# Patient Record
Sex: Female | Born: 1990 | Race: Black or African American | Hispanic: No | Marital: Single | Smoking: Never smoker
Health system: Southern US, Community
[De-identification: ages and names within clinical notes are randomized; demographics above are authoritative.]

## PROBLEM LIST (undated history)

## (undated) ENCOUNTER — Inpatient Hospital Stay (HOSPITAL_COMMUNITY): Payer: Self-pay

## (undated) DIAGNOSIS — Z789 Other specified health status: Secondary | ICD-10-CM

## (undated) HISTORY — PX: NO PAST SURGERIES: SHX2092

---

## 2014-08-28 ENCOUNTER — Encounter (HOSPITAL_COMMUNITY): Payer: Self-pay | Admitting: Emergency Medicine

## 2014-08-28 ENCOUNTER — Emergency Department (INDEPENDENT_AMBULATORY_CARE_PROVIDER_SITE_OTHER)
Admission: EM | Admit: 2014-08-28 | Discharge: 2014-08-28 | Disposition: A | Discharge status: Home / Self Care | Payer: BLUE CROSS/BLUE SHIELD | Source: Home / Self Care | Attending: Family Medicine | Admitting: Family Medicine

## 2014-08-28 DIAGNOSIS — O219 Vomiting of pregnancy, unspecified: Secondary | ICD-10-CM | POA: Diagnosis not present

## 2014-08-28 MED ORDER — DOXYLAMINE-PYRIDOXINE 10-10 MG PO TBEC: DELAYED_RELEASE_TABLET | ORAL | Status: DC

## 2014-08-28 MED ORDER — PRENATAL COMPLETE 14-0.4 MG PO TABS: ORAL_TABLET | ORAL | Status: AC

## 2014-08-28 NOTE — ED Provider Notes (Signed)
CSN: [id number]     Arrival date & time 08/28/14  1044 History   First MD Initiated Contact with Patient 08/28/14 1239     Chief Complaint  Patient presents with  . Morning Sickness   (Consider location/radiation/quality/duration/timing/severity/associated sxs/prior Treatment) HPI     24 year old female who is [redacted] weeks pregnant presents complaining of vomiting and very mild feeling of lightheadedness. She states that she has been vomiting in the mornings throughout her pregnancy but has gotten worse in the past couple of days. She called her OB/GYN but they do not call her back so she decided to come here. In the past couple of days she has vomited approximately 6-7 times. Prior to that she has been vomiting in the mornings and occasionally after eating. He denies any abdominal pain or vaginal bleeding. She had her initial ultrasound 3 weeks ago which was normal, showed intrauterine pregnancy  History reviewed. No pertinent past medical history. History reviewed. No pertinent past surgical history. No family history on file. History  Substance Use Topics  . Smoking status: Never Smoker   . Smokeless tobacco: Not on file  . Alcohol Use: No   OB History    Gravida Para Term Preterm AB TAB SAB Ectopic Multiple Living   1              Review of Systems  Gastrointestinal: Positive for nausea and vomiting.  Neurological: Positive for light-headedness.  All other systems reviewed and are negative.   Allergies  Review of patient's allergies indicates no known allergies.  Home Medications   Prior to Admission medications   Medication Sig Start Date End Date Taking? Authorizing Provider  Doxylamine-Pyridoxine 10-10 MG TBEC Take 2 tablets at bedtime daily to treat and prevent nausea and vomiting 08/28/14   Graylon GoodZachary H Holton Sidman, PA-C  Prenatal Vit-Fe Fumarate-FA (PRENATAL COMPLETE) 14-0.4 MG TABS Uses directed on bottle instructions 08/28/14   Graylon GoodZachary H Swathi Dauphin, PA-C   BP 124/76 mmHg  Pulse 76   Temp(Src) 98.1 F (36.7 C) (Oral)  SpO2 100%  LMP 06/24/2014 Physical Exam  Constitutional: She is oriented to person, place, and time. Vital signs are normal. She appears well-developed and well-nourished. No distress.  HENT:  Head: Normocephalic and atraumatic.  Cardiovascular: Normal rate, regular rhythm and normal heart sounds.   Pulmonary/Chest: Effort normal and breath sounds normal. No respiratory distress.  Abdominal: Soft. Bowel sounds are normal. She exhibits no distension and no mass. There is no tenderness. There is no rebound and no guarding.  Neurological: She is alert and oriented to person, place, and time. She has normal strength. Coordination normal.  Skin: Skin is warm and dry. No rash noted. She is not diaphoretic.  Psychiatric: She has a normal mood and affect. Judgment normal.  Nursing note and vitals reviewed.   ED Course  Procedures (including critical care time) Labs Review Labs Reviewed - No data to display  Imaging Review No results found.   MDM   1. Nausea/vomiting in pregnancy    Physical exam is normal, fundus is not palpable. She is not orthostatic. I will prescribe her diclegis for nausea and vomiting with instructions to follow-up with OB/GYN if this does not help her symptoms, or [facility]. Also she is not on prenatal vitamins, she will start taking these.  Meds ordered this encounter  Medications  . DISCONTD: Doxylamine-Pyridoxine 10-10 MG TBEC    Sig: Take 2 tablets at bedtime daily to treat and prevent  nausea and vomiting    Dispense:  60 tablet    Refill:  2  . Doxylamine-Pyridoxine 10-10 MG TBEC    Sig: Take 2 tablets at bedtime daily to treat and prevent nausea and vomiting    Dispense:  60 tablet    Refill:  2  . Prenatal Vit-Fe Fumarate-FA (PRENATAL COMPLETE) 14-0.4 MG TABS    Sig: Uses directed on bottle instructions    Dispense:  60 each    Refill:  8       Graylon GoodZachary H Dietrick Barris,  PA-C 08/28/14 1655

## 2014-08-28 NOTE — Discharge Instructions (Signed)
Morning Sickness Morning sickness is when you feel sick to your stomach (nauseous) during pregnancy. This nauseous feeling may or may not come with vomiting. It often occurs in the morning but can be a problem any time of day. Morning sickness is most common during the first trimester, but it may continue throughout pregnancy. While morning sickness is unpleasant, it is usually harmless unless you develop severe and continual vomiting (hyperemesis gravidarum). This condition requires more intense treatment.  CAUSES  The cause of morning sickness is not completely known but seems to be related to normal hormonal changes that occur in pregnancy. RISK FACTORS You are at greater risk if you:  Experienced nausea or vomiting before your pregnancy.  Had morning sickness during a previous pregnancy.  Are pregnant with more than one baby, such as twins. TREATMENT  Do not use any medicines (prescription, over-the-counter, or herbal) for morning sickness without first talking to your health care provider. Your health care provider may prescribe or recommend:  Vitamin B6 supplements.  Anti-nausea medicines.  The herbal medicine ginger. HOME CARE INSTRUCTIONS   Only take over-the-counter or prescription medicines as directed by your health care provider.  Taking multivitamins before getting pregnant can prevent or decrease the severity of morning sickness in most women.  Eat a piece of dry toast or unsalted crackers before getting out of bed in the morning.  Eat five or six small meals a day.  Eat dry and bland foods (rice, baked potato). Foods high in carbohydrates are often helpful.  Do not drink liquids with your meals. Drink liquids between meals.  Avoid greasy, fatty, and spicy foods.  Get someone to cook for you if the smell of any food causes nausea and vomiting.  If you feel nauseous after taking prenatal vitamins, take the vitamins at night or with a snack.  Snack on protein  foods (nuts, yogurt, cheese) between meals if you are hungry.  Eat unsweetened gelatins for desserts.  Wearing an acupressure wristband (worn for sea sickness) may be helpful.  Acupuncture may be helpful.  Do not smoke.  Get a humidifier to keep the air in your house free of odors.  Get plenty of fresh air. SEEK MEDICAL CARE IF:   Your home remedies are not working, and you need medicine.  You feel dizzy or lightheaded.  You are losing weight. SEEK IMMEDIATE MEDICAL CARE IF:   You have persistent and uncontrolled nausea and vomiting.  You pass out (faint). MAKE SURE YOU:  Understand these instructions.  Will watch your condition.  Will get help right away if you are not doing well or get worse. Document Released: 06/03/2006 Document Revised: 04/17/2013 Document Reviewed: 09/27/2012 [facility] Patient Information 2015 [place], [place]. This information is not intended to replace advice given to you by your health care provider. Make sure you discuss any questions you have with your health care provider.  Hyperemesis Gravidarum Hyperemesis gravidarum is a severe form of nausea and vomiting that happens during pregnancy. Hyperemesis is worse than morning sickness. It may cause you to have nausea or vomiting all day for many days. It may keep you from eating and drinking enough food and liquids. Hyperemesis usually occurs during the first half (the first 20 weeks) of pregnancy. It often goes away once a woman is in her second half of pregnancy. However, sometimes hyperemesis continues through an entire pregnancy.  CAUSES  The cause of this condition is not completely known but is thought to be related to changes in  the body's hormones when pregnant. It could be from the high level of the pregnancy hormone or an increase in estrogen in the body.  SIGNS AND SYMPTOMS   Severe nausea and vomiting.  Nausea that does not go away.  Vomiting that does not allow you to keep any food  down.  Weight loss and body fluid loss (dehydration).  Having no desire to eat or not liking food you have previously enjoyed. DIAGNOSIS  Your health care provider will do a physical exam and ask you about your symptoms. He or she may also order blood tests and urine tests to make sure something else is not causing the problem.  TREATMENT  You may only need medicine to control the problem. If medicines do not control the nausea and vomiting, you will be treated in the hospital to prevent dehydration, increased acid in the blood (acidosis), weight loss, and changes in the electrolytes in your body that may harm the unborn baby (fetus). You may need IV fluids.  HOME CARE INSTRUCTIONS   Only take over-the-counter or prescription medicines as directed by your health care provider.  Try eating a couple of dry crackers or toast in the morning before getting out of bed.  Avoid foods and smells that upset your stomach.  Avoid fatty and spicy foods.  Eat 5-6 small meals a day.  Do not drink when eating meals. Drink between meals.  For snacks, eat high-protein foods, such as cheese.  Eat or suck on things that have ginger in them. Ginger helps nausea.  Avoid food preparation. The smell of food can spoil your appetite.  Avoid iron pills and iron in your multivitamins until after 3-4 months of being pregnant. However, consult with your health care provider before stopping any prescribed iron pills. SEEK MEDICAL CARE IF:   Your abdominal pain increases.  You have a severe headache.  You have vision problems.  You are losing weight. SEEK IMMEDIATE MEDICAL CARE IF:   You are unable to keep fluids down.  You vomit blood.  You have constant nausea and vomiting.  You have excessive weakness.  You have extreme thirst.  You have dizziness or fainting.  You have a fever or persistent symptoms for more than 2-3 days.  You have a fever and your symptoms suddenly get worse. MAKE SURE  YOU:   Understand these instructions.  Will watch your condition.  Will get help right away if you are not doing well or get worse. Document Released: 04/12/2005 Document Revised: 01/31/2013 Document Reviewed: 11/22/2012 [facility] Patient Information 2015 [place], [place]. This information is not intended to replace advice given to you by your health care provider. Make sure you discuss any questions you have with your health care provider.

## 2014-08-28 NOTE — ED Notes (Signed)
C/o vomiting and feeling light headed onset 2 days Sx also include vomiting; 2 month preg Alert, no signs of acute distress.

## 2016-04-28 ENCOUNTER — Emergency Department (HOSPITAL_COMMUNITY)
Admission: EM | Admit: 2016-04-28 | Discharge: 2016-04-28 | Disposition: A | Discharge status: Home / Self Care | Payer: BLUE CROSS/BLUE SHIELD | Attending: Emergency Medicine | Admitting: Emergency Medicine

## 2016-04-28 ENCOUNTER — Encounter (HOSPITAL_COMMUNITY): Payer: Self-pay | Admitting: Emergency Medicine

## 2016-04-28 DIAGNOSIS — R112 Nausea with vomiting, unspecified: Secondary | ICD-10-CM

## 2016-04-28 LAB — COMPREHENSIVE METABOLIC PANEL
ALT: 9 U/L — ABNORMAL LOW (ref 14–54)
ANION GAP: 9 (ref 5–15)
AST: 22 U/L (ref 15–41)
Albumin: 3.8 g/dL (ref 3.5–5.0)
Alkaline Phosphatase: 93 U/L (ref 38–126)
BILIRUBIN TOTAL: 1.4 mg/dL — AB (ref 0.3–1.2)
BUN: 13 mg/dL (ref 6–20)
CO2: 20 mmol/L — ABNORMAL LOW (ref 22–32)
Calcium: 8.6 mg/dL — ABNORMAL LOW (ref 8.9–10.3)
Chloride: 107 mmol/L (ref 101–111)
Creatinine, Ser: 0.53 mg/dL (ref 0.44–1.00)
GFR calc Af Amer: >60 mL/min (ref >60)
GFR calc non Af Amer: >60 mL/min (ref >60)
Glucose, Bld: 100 mg/dL — ABNORMAL HIGH (ref 65–99)
Potassium: 5.2 mmol/L — ABNORMAL HIGH (ref 3.5–5.1)
Sodium: 136 mmol/L (ref 135–145)
TOTAL PROTEIN: 7.1 g/dL (ref 6.5–8.1)

## 2016-04-28 LAB — I-STAT BETA HCG BLOOD, ED (MC, WL, AP ONLY)

## 2016-04-28 LAB — LIPASE, BLOOD: Lipase: 20 U/L (ref 11–51)

## 2016-04-28 MED ORDER — ONDANSETRON 8 MG PO TBDP: 8.0000 mg | ORAL_TABLET | Freq: Three times a day (TID) | ORAL | 0 refills | Status: AC | PRN

## 2016-04-28 MED ORDER — ONDANSETRON 4 MG PO TBDP
8.0000 mg | ORAL_TABLET | Freq: Once | ORAL | Status: AC
  Administered 2016-04-28: 8 mg via ORAL
  Filled 2016-04-28: qty 2

## 2016-04-28 NOTE — ED Provider Notes (Signed)
[facility]-EMERGENCY DEPT Provider Note   CSN: [id number] Arrival date & time: 04/28/16  1702  By signing my name below, I, Arianna Nassar, attest that this documentation has been prepared under the direction and in the presence of Margarita Grizzleanielle Migel Hannis, MD.  Electronically Signed: Octavia HeirArianna Nassar, ED Scribe. 04/28/16. 6:10 PM.    History   Chief Complaint Chief Complaint  Patient presents with  . Generalized Body Aches  . Emesis   The history is provided by the patient. No language interpreter was used.   HPI Comments: Alyssa Bates is a 26 y.o. female who presents to the Emergency Department complaining of sudden onset, gradual worsening, moderate vomiting x last night. She says she was awoken out of her sleep last night feeling dizzy. She says that she vomited twice last night (and once this morning) with associated intermittent chills, generalized boyd aches, and mild abdominal pain. Pt was asymptomatic before going to bed last night. Pt has not taken any medication to relieve her symptoms. She has been around her coworkers who have had similar symptoms. Pt is a non-smoker and does not consume alcohol. Pt is currently on her menstrual cycle. She denies diarrhea or fever.  History reviewed. No pertinent past medical history.  There are no active problems to display for this patient.   History reviewed. No pertinent surgical history.  OB History    Gravida Para Term Preterm AB Living   1             SAB TAB Ectopic Multiple Live Births                   Home Medications    Prior to Admission medications   Medication Sig Start Date End Date Taking? Authorizing Provider  Doxylamine-Pyridoxine 10-10 MG TBEC Take 2 tablets at bedtime daily to treat and prevent nausea and vomiting 08/28/14   Graylon GoodZachary H Baker, PA-C  Prenatal Vit-Fe Fumarate-FA (PRENATAL COMPLETE) 14-0.4 MG TABS Uses directed on bottle instructions 08/28/14   Graylon GoodZachary H Baker, PA-C    Family History History reviewed. No  pertinent family history.  Social History Social History  Substance Use Topics  . Smoking status: Never Smoker  . Smokeless tobacco: Not on file  . Alcohol use No     Allergies   Ibuprofen   Review of Systems Review of Systems  Constitutional: Positive for chills. Negative for fever.  Gastrointestinal: Positive for abdominal pain, nausea and vomiting. Negative for diarrhea.  Musculoskeletal: Positive for myalgias.  All other systems reviewed and are negative.    Physical Exam Updated Vital Signs BP 124/80 (BP Location: Right Arm)   Pulse 103   Temp 99.7 F (37.6 C) (Oral)   Resp 18   SpO2 100%   Physical Exam  Constitutional: She is oriented to person, place, and time. She appears well-developed and well-nourished.  HENT:  Head: Normocephalic.  Eyes: EOM are normal.  Neck: Normal range of motion.  Cardiovascular: Normal rate.   Pulmonary/Chest: Effort normal.  Abdominal: She exhibits no distension.  Musculoskeletal: Normal range of motion.  Neurological: She is alert and oriented to person, place, and time.  Psychiatric: She has a normal mood and affect.  Nursing note and vitals reviewed.    ED Treatments / Results  DIAGNOSTIC STUDIES: Oxygen Saturation is 100% on RA, normal by my interpretation.  COORDINATION OF CARE:  6:09 PM Discussed treatment plan with pt at bedside and pt agreed to plan.  Labs (all labs ordered are listed, but only  abnormal results are displayed) Labs Reviewed  COMPREHENSIVE METABOLIC PANEL - Abnormal; Notable for the following:       Result Value   Potassium 5.2 (*)    CO2 20 (*)    Glucose, Bld 100 (*)    Calcium 8.6 (*)    ALT 9 (*)    Total Bilirubin 1.4 (*)    All other components within normal limits  LIPASE, BLOOD  URINALYSIS, ROUTINE W REFLEX MICROSCOPIC  I-STAT BETA HCG BLOOD, ED ([facility], [facility], [facility] ONLY)   Procedures Procedures (including critical care time)  Medications Ordered in ED Medications - No data to  display   Initial Impression / Assessment and Plan / ED Course  I have reviewed the triage vital signs and the nursing notes.  Pertinent labs & imaging results that were available during my care of the patient were reviewed by me and considered in my medical decision making (see chart for details).  Clinical Course    Patient tolerating fluids here without difficulty and well appearing.  Mild hypocalcemia noted and patient advised re follow up.   Final Clinical Impressions(s) / ED Diagnoses   Final diagnoses:  Non-intractable vomiting with nausea, unspecified vomiting type   I personally performed the services described in this documentation, which was scribed in my presence. The recorded information has been reviewed and considered.  New Prescriptions New Prescriptions   No medications on file     Margarita Grizzle, MD 04/28/16 2041

## 2016-04-28 NOTE — ED Triage Notes (Signed)
Pt sts body aches and URI sx with vomiting x 2 days

## 2016-08-09 ENCOUNTER — Inpatient Hospital Stay (HOSPITAL_COMMUNITY)
Admission: AD | Admit: 2016-08-09 | Discharge: 2016-08-09 | Disposition: A | Discharge status: Home / Self Care | Payer: Medicaid Other | Source: Ambulatory Visit | Attending: Obstetrics and Gynecology | Admitting: Obstetrics and Gynecology

## 2016-08-09 ENCOUNTER — Encounter (HOSPITAL_COMMUNITY): Payer: Self-pay | Admitting: *Deleted

## 2016-08-09 DIAGNOSIS — O209 Hemorrhage in early pregnancy, unspecified: Secondary | ICD-10-CM

## 2016-08-09 DIAGNOSIS — Z3A01 Less than 8 weeks gestation of pregnancy: Secondary | ICD-10-CM | POA: Diagnosis not present

## 2016-08-09 DIAGNOSIS — O039 Complete or unspecified spontaneous abortion without complication: Secondary | ICD-10-CM | POA: Diagnosis not present

## 2016-08-09 DIAGNOSIS — Z87891 Personal history of nicotine dependence: Secondary | ICD-10-CM | POA: Insufficient documentation

## 2016-08-09 DIAGNOSIS — O4690 Antepartum hemorrhage, unspecified, unspecified trimester: Secondary | ICD-10-CM | POA: Insufficient documentation

## 2016-08-09 HISTORY — DX: Other specified health status: Z78.9

## 2016-08-09 LAB — URINALYSIS, ROUTINE W REFLEX MICROSCOPIC
Bilirubin Urine: NEGATIVE
Glucose, UA: NEGATIVE mg/dL
Ketones, ur: NEGATIVE mg/dL
NITRITE: NEGATIVE
PROTEIN: 30 mg/dL — AB
SPECIFIC GRAVITY, URINE: 1.023 (ref 1.005–1.030)
pH: 7 (ref 5.0–8.0)

## 2016-08-09 LAB — HCG, QUANTITATIVE, PREGNANCY: HCG, BETA CHAIN, QUANT, S: 103 m[IU]/mL — AB (ref <5)

## 2016-08-09 LAB — ABO/RH: ABO/RH(D): B POS

## 2016-08-09 NOTE — MAU Note (Signed)
Found out preg last Wed,  Went to [facility] for bleeding or Friday.  Was dx with UTI, had [place]- viable IUP.  Is taking the antibiotics, but still bleeding so wanted to get checked.

## 2016-08-09 NOTE — MAU Provider Note (Signed)
History     CSN: [id number]  Arrival date and time: 08/09/16 1121   First Provider Initiated Contact with Patient 08/09/16 1251      No chief complaint on file.  HPI  Alyssa Bates is a 26 y.o. female G2P1001 @ [redacted]w[redacted]d here in MAU with continued vaginal bleeding. She was seen 2 days ago at [facility] with the same complaints. She had intercourse that day and started spotting. The bleeding has not increased, it comes and goes. She denies pain. The bleeding, at times, is similar to her menstrual cycle.   B positive blood type   OB History    Gravida Para Term Preterm AB Living   SAB TAB Ectopic Multiple Live Births           1      Past Medical History:  Diagnosis Date  . Medical history non-contributory     Past Surgical History:  Procedure Laterality Date  . NO PAST SURGERIES      No family history on file.  Social History  Substance Use Topics  . Smoking status: Former Smoker    Quit date: 07/25/2016  . Smokeless tobacco: Never Used  . Alcohol use No    Allergies:  Allergies  Allergen Reactions  . Ibuprofen Swelling    facial    Prescriptions Prior to Admission  Medication Sig Dispense Refill Last Dose  . nitrofurantoin, macrocrystal-monohydrate, (MACROBID) 100 MG capsule Take 100 mg by mouth 2 (two) times daily. 7 day course     . Doxylamine-Pyridoxine 10-10 MG TBEC Take 2 tablets at bedtime daily to treat and prevent nausea and vomiting (Patient not taking: Reported on 08/09/2016) 60 tablet 2 Not Taking at Unknown time  . ondansetron (ZOFRAN ODT) 8 MG disintegrating tablet Take 1 tablet (8 mg total) by mouth every 8 (eight) hours as needed for nausea or vomiting. (Patient not taking: Reported on 08/09/2016) 20 tablet 0 Not Taking at Unknown time  . Prenatal Vit-Fe Fumarate-FA (PRENATAL COMPLETE) 14-0.4 MG TABS Uses directed on bottle instructions (Patient not taking: Reported on 08/09/2016) 60 each 8 Not Taking at Unknown time    Results for orders placed or performed during the hospital encounter of 08/09/16 (from the past 48 hour(s))  Urinalysis, Routine w reflex microscopic     Status: Abnormal   Collection Time: 08/09/16 11:45 AM  Result Value Ref Range   Color, Urine AMBER (A) YELLOW    Comment: BIOCHEMICALS MAY BE AFFECTED BY COLOR   APPearance HAZY (A) CLEAR   Specific Gravity, Urine 1.023 1.005 - 1.030   pH 7.0 5.0 - 8.0   Glucose, UA NEGATIVE NEGATIVE mg/dL   Hgb urine dipstick LARGE (A) NEGATIVE   Bilirubin Urine NEGATIVE NEGATIVE   Ketones, ur NEGATIVE NEGATIVE mg/dL   Protein, ur 30 (A) NEGATIVE mg/dL   Nitrite NEGATIVE NEGATIVE   Leukocytes, UA SMALL (A) NEGATIVE   RBC / HPF TOO NUMEROUS TO COUNT 0 - 5 RBC/hpf   WBC, UA 6-30 0 - 5 WBC/hpf   Bacteria, UA RARE (A) NONE SEEN   Squamous Epithelial / LPF 0-5 (A) NONE SEEN   Mucous PRESENT   hCG, quantitative, pregnancy     Status: Abnormal   Collection Time: 08/09/16  1:43 PM  Result Value Ref Range   hCG, Beta Chain, Quant, S 103 (H) <5 mIU/mL    Comment:          GEST. AGE  CONC.  (mIU/mL)   <=1 WEEK        5 - 50     2 WEEKS       50 - 500     3 WEEKS       100 - 10,000     4 WEEKS     1,000 - 30,000     5 WEEKS     3,500 - 115,000   6-8 WEEKS     12,000 - 270,000    12 WEEKS     15,000 - 220,000        FEMALE AND NON-PREGNANT FEMALE:     LESS THAN 5 mIU/mL   ABO/Rh     Status: None   Collection Time: 08/09/16  1:43 PM  Result Value Ref Range   ABO/RH(D) B POS     Review of Systems  Constitutional: Negative for fever.  Gastrointestinal: Negative for abdominal distention.  Genitourinary: Positive for vaginal bleeding.   Physical Exam   Blood pressure 128/85, pulse 73, temperature 98.3 F (36.8 C), temperature source Oral, resp. rate 16, height  (1.6 m), weight 211 lb 4 oz (95.8 kg), last menstrual period 06/20/2016, SpO2 100 %.  Physical Exam  Constitutional: She is oriented to person, place, and time. She appears  well-developed and well-nourished. No distress.  HENT:  Head: Normocephalic.  Eyes: Pupils are equal, round, and reactive to light.  GI: Soft. She exhibits no distension. There is no tenderness. There is no rebound.  Musculoskeletal: Normal range of motion.  Neurological: She is alert and oriented to person, place, and time.  Skin: Skin is warm. She is not diaphoretic. No pallor.  Psychiatric: Her behavior is normal.   MAU Course  Procedures  None  MDM  Quant yesterday found in Care Everywhere: 874, US shows SIUP with cardiac activity.  Beta hcg ordered today While patient waiting for quant, patient passed a silver dollar sized clot; with tissue like appearance. Sent to pathology for ?POC, bleeding minimal at this time.  Quant down today: 103  Assessment and Plan   A:  1. SAB (spontaneous abortion)   2. Vaginal bleeding in pregnancy, first trimester     P:  Discharge home in stable condition Ok to take ibuprofen over the counter, as directed on the bottle.  Message sent to the [facility] for follow up in 1-2 weeks.   Bleeding precautions Return if symptoms worsen   Duane Lope, NP 08/11/2016 1:28 PM

## 2016-08-09 NOTE — Discharge Instructions (Signed)

## 2016-08-25 ENCOUNTER — Other Ambulatory Visit: Payer: Self-pay

## 2016-10-11 ENCOUNTER — Emergency Department (HOSPITAL_BASED_OUTPATIENT_CLINIC_OR_DEPARTMENT_OTHER)
Admission: EM | Admit: 2016-10-11 | Discharge: 2016-10-11 | Disposition: A | Discharge status: Home / Self Care | Payer: Medicaid Other | Attending: Emergency Medicine | Admitting: Emergency Medicine

## 2016-10-11 ENCOUNTER — Encounter (HOSPITAL_BASED_OUTPATIENT_CLINIC_OR_DEPARTMENT_OTHER): Payer: Self-pay | Admitting: *Deleted

## 2016-10-11 DIAGNOSIS — Z5321 Procedure and treatment not carried out due to patient leaving prior to being seen by health care provider: Secondary | ICD-10-CM | POA: Diagnosis not present

## 2016-10-11 DIAGNOSIS — H5712 Ocular pain, left eye: Secondary | ICD-10-CM | POA: Diagnosis present
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
## 2016-10-11 NOTE — ED Notes (Signed)
Pt states she has to leave d/t child care; states if it gets worse she will come back

## 2019-04-04 ENCOUNTER — Encounter (HOSPITAL_BASED_OUTPATIENT_CLINIC_OR_DEPARTMENT_OTHER): Payer: Self-pay

## 2019-04-04 ENCOUNTER — Emergency Department (HOSPITAL_BASED_OUTPATIENT_CLINIC_OR_DEPARTMENT_OTHER)
Admission: EM | Admit: 2019-04-04 | Discharge: 2019-04-04 | Disposition: A | Discharge status: Home / Self Care | Payer: Self-pay | Attending: Emergency Medicine | Admitting: Emergency Medicine

## 2019-04-04 ENCOUNTER — Other Ambulatory Visit: Payer: Self-pay

## 2019-04-04 DIAGNOSIS — R519 Headache, unspecified: Secondary | ICD-10-CM | POA: Insufficient documentation

## 2019-04-04 MED ORDER — METOCLOPRAMIDE HCL 5 MG/ML IJ SOLN
10.0000 mg | Freq: Once | INTRAMUSCULAR | Status: AC
  Administered 2019-04-04: 10 mg via INTRAMUSCULAR
  Filled 2019-04-04: qty 2

## 2019-04-04 MED ORDER — ACETAMINOPHEN 500 MG PO TABS
1000.0000 mg | ORAL_TABLET | Freq: Once | ORAL | Status: AC
  Administered 2019-04-04: 18:00:00 1000 mg via ORAL
  Filled 2019-04-04: qty 2

## 2019-04-04 NOTE — ED Triage Notes (Signed)
Pt c/o HA x 4 days-pain site is across forehead-denies head injury-denies fever/flu like sx-NAD-steady gait

## 2019-04-04 NOTE — ED Provider Notes (Signed)
[facility] EMERGENCY DEPARTMENT Provider Note   CSN: [id number] Arrival date & time: 04/04/19  1633     History   Chief Complaint Chief Complaint  Patient presents with  . Headache    HPI Alyssa Bates is a 28 y.o. female.     Patient c/o frontal headache intermittently for past 3-4 days. Symptoms gradual onset, mild-moderate, recurrent, dull to throbbing, not radiating. No hx migraines or chronic headaches, gets occasional headache in past. Symptoms last several minutes to a few hours. Not associated with any certain activity, position or time of day. No photophobia. No eye pain or change in vision. No recent head injury, trauma, fall, or syncope. No neck pain or stiffness. Denies change in speech or vision. No numbness/weakness, or change in gait/balance or normal functional ability. No fever or chills. No sinus drainage or pain. Tried acetaminophen 3 days ago w some relief, but no meds since. No acute, abrupt, severe, or thunderclap type headaches.   The history is provided by the patient.  Headache Associated symptoms: no abdominal pain, no cough, no eye pain, no fever, no nausea, no neck pain, no neck stiffness, no numbness, no sore throat, no vomiting and no weakness     Past Medical History:  Diagnosis Date  . Medical history non-contributory     There are no active problems to display for this patient.   Past Surgical History:  Procedure Laterality Date  . NO PAST SURGERIES       OB History    Gravida  2   Para  1   Term  1   Preterm      AB      Living  1     SAB      TAB      Ectopic      Multiple      Live Births  1            Home Medications    Prior to Admission medications   Medication Sig Start Date End Date Taking? Authorizing Provider  ondansetron (ZOFRAN ODT) 8 MG disintegrating tablet Take 1 tablet (8 mg total) by mouth every 8 (eight) hours as needed for nausea or vomiting. Patient not taking: Reported  on 08/09/2016 04/28/16   Margarita Grizzleay, Danielle, MD  Prenatal Vit-Fe Fumarate-FA (PRENATAL COMPLETE) 14-0.4 MG TABS Uses directed on bottle instructions Patient not taking: Reported on 08/09/2016 08/28/14   Graylon GoodBaker, Zachary H, PA-C    Family History No family history on file.  Social History Social History   Tobacco Use  . Smoking status: Never Smoker  . Smokeless tobacco: Never Used  Substance Use Topics  . Alcohol use: Yes    Comment: occ  . Drug use: No     Allergies   Ibuprofen   Review of Systems Review of Systems  Constitutional: Negative for chills and fever.  HENT: Negative for sinus pain and sore throat.   Eyes: Negative for pain, redness and visual disturbance.  Respiratory: Negative for cough and shortness of breath.   Cardiovascular: Negative for chest pain.  Gastrointestinal: Negative for abdominal pain, nausea and vomiting.  Genitourinary: Negative for flank pain.  Musculoskeletal: Negative for neck pain and neck stiffness.  Skin: Negative for rash.  Neurological: Positive for headaches. Negative for speech difficulty, weakness and numbness.  Hematological: Does not bruise/bleed easily.  Psychiatric/Behavioral: Negative for confusion.     Physical Exam Updated Vital Signs BP 140/85 (BP Location: Right Arm)   Pulse 82  Temp 98.5 F (36.9 C) (Oral)   Resp 18   Ht 1.6 m (5\' 3" )   Wt 100.2 kg   LMP 03/29/2019   SpO2 100%   BMI 39.15 kg/m   Physical Exam Vitals signs and nursing note reviewed.  Constitutional:      General: She is not in acute distress.    Appearance: Normal appearance. She is well-developed. She is not diaphoretic.  HENT:     Head: Atraumatic.     Comments: No sinus or temporal tenderness.     Nose: Nose normal.     Mouth/Throat:     Mouth: Mucous membranes are moist.  Eyes:     General: No scleral icterus.    Conjunctiva/sclera: Conjunctivae normal.     Pupils: Pupils are equal, round, and reactive to light.  Neck:      Musculoskeletal: Normal range of motion and neck supple. No neck rigidity or muscular tenderness.     Thyroid: No thyromegaly.     Trachea: No tracheal deviation.     Comments: No stiffness or rigidity.  Cardiovascular:     Rate and Rhythm: Normal rate and regular rhythm.     Pulses: Normal pulses.     Heart sounds: Normal heart sounds. No murmur. No friction rub. No gallop.   Pulmonary:     Effort: Pulmonary effort is normal. No respiratory distress.     Breath sounds: Normal breath sounds.  Abdominal:     General: Bowel sounds are normal. There is no distension.     Palpations: Abdomen is soft.     Tenderness: There is no abdominal tenderness.  Genitourinary:    Comments: No cva tenderness.  Musculoskeletal: Normal range of motion.        General: No swelling or tenderness.  Skin:    General: Skin is warm and dry.     Findings: No rash.  Neurological:     Mental Status: She is alert and oriented to person, place, and time.     Cranial Nerves: No cranial nerve deficit.     Comments: Alert, speech normal. Motor intact bil, stre 5/5. No pronator drift. sens grossly intact bil. Steady gait.   Psychiatric:        Mood and Affect: Mood normal.      ED Treatments / Results  Labs (all labs ordered are listed, but only abnormal results are displayed) Labs Reviewed - No data to display  EKG None  Radiology No results found.  Procedures Procedures (including critical care time)  Medications Ordered in ED Medications  metoCLOPramide (REGLAN) injection 10 mg (has no administration in time range)  acetaminophen (TYLENOL) tablet 1,000 mg (has no administration in time range)     Initial Impression / Assessment and Plan / ED Course  I have reviewed the triage vital signs and the nursing notes.  Pertinent labs & imaging results that were available during my care of the patient were reviewed by me and considered in my medical decision making (see chart for details).  No meds  pta/today. reglan im. Acetaminophen po.  Po fluids.  Reviewed nursing notes and prior charts for additional history.   Recheck, headache improved, but comfortable, no n/v.   Patient currently appears stable for d/c.   Rec pcp f/u.  Return precautions provided.     Final Clinical Impressions(s) / ED Diagnoses   Final diagnoses:  None    ED Discharge Orders    None       Lajean Saver,  MD 04/04/19 1835

## 2019-04-04 NOTE — Discharge Instructions (Addendum)
It was our pleasure to provide your ER care today - we hope that you feel better.  Rest. Drink plenty of fluids.  Try taking excedrin or acetaminophen as need for headache.   Follow up with primary care doctor in 1 week if symptoms fail to improve/resolve.  Return to ER if worse, new symptoms, fevers, worsening or severe pain, persistent vomiting, change in speech or vision, numbness or weakness, or other concern.

## 2020-02-17 ENCOUNTER — Other Ambulatory Visit: Payer: Self-pay

## 2020-02-17 ENCOUNTER — Emergency Department (HOSPITAL_BASED_OUTPATIENT_CLINIC_OR_DEPARTMENT_OTHER)
Admission: EM | Admit: 2020-02-17 | Discharge: 2020-02-17 | Disposition: A | Discharge status: Home / Self Care | Payer: BLUE CROSS/BLUE SHIELD | Attending: Emergency Medicine | Admitting: Emergency Medicine

## 2020-02-17 ENCOUNTER — Emergency Department (HOSPITAL_BASED_OUTPATIENT_CLINIC_OR_DEPARTMENT_OTHER): Payer: BLUE CROSS/BLUE SHIELD

## 2020-02-17 ENCOUNTER — Encounter (HOSPITAL_BASED_OUTPATIENT_CLINIC_OR_DEPARTMENT_OTHER): Payer: Self-pay | Admitting: Emergency Medicine

## 2020-02-17 DIAGNOSIS — O209 Hemorrhage in early pregnancy, unspecified: Secondary | ICD-10-CM | POA: Diagnosis not present

## 2020-02-17 DIAGNOSIS — O26892 Other specified pregnancy related conditions, second trimester: Secondary | ICD-10-CM | POA: Diagnosis present

## 2020-02-17 DIAGNOSIS — Z3A20 20 weeks gestation of pregnancy: Secondary | ICD-10-CM | POA: Insufficient documentation

## 2020-02-17 DIAGNOSIS — N939 Abnormal uterine and vaginal bleeding, unspecified: Secondary | ICD-10-CM

## 2020-02-17 DIAGNOSIS — N3001 Acute cystitis with hematuria: Secondary | ICD-10-CM

## 2020-02-17 DIAGNOSIS — O2310 Infections of bladder in pregnancy, unspecified trimester: Secondary | ICD-10-CM | POA: Insufficient documentation

## 2020-02-17 DIAGNOSIS — O4690 Antepartum hemorrhage, unspecified, unspecified trimester: Secondary | ICD-10-CM

## 2020-02-17 LAB — URINALYSIS, MICROSCOPIC (REFLEX)

## 2020-02-17 LAB — URINALYSIS, ROUTINE W REFLEX MICROSCOPIC
Bilirubin Urine: NEGATIVE
Glucose, UA: NEGATIVE mg/dL
Ketones, ur: >80 mg/dL — AB
Nitrite: NEGATIVE
Protein, ur: 30 mg/dL — AB
Specific Gravity, Urine: 1.025 (ref 1.005–1.030)
pH: 7 (ref 5.0–8.0)

## 2020-02-17 LAB — CBC
HCT: 35.8 % — ABNORMAL LOW (ref 36.0–46.0)
Hemoglobin: 12 g/dL (ref 12.0–15.0)
MCH: 29.4 pg (ref 26.0–34.0)
MCHC: 33.5 g/dL (ref 30.0–36.0)
MCV: 87.7 fL (ref 80.0–100.0)
Platelets: 198 10*3/uL (ref 150–400)
RBC: 4.08 MIL/uL (ref 3.87–5.11)
RDW: 16.8 % — ABNORMAL HIGH (ref 11.5–15.5)
WBC: 8.8 10*3/uL (ref 4.0–10.5)
nRBC: 0 % (ref 0.0–0.2)

## 2020-02-17 LAB — HCG, QUANTITATIVE, PREGNANCY: hCG, Beta Chain, Quant, S: 18425 m[IU]/mL — ABNORMAL HIGH (ref <5)

## 2020-02-17 MED ORDER — CEPHALEXIN 500 MG PO CAPS: 500.0000 mg | ORAL_CAPSULE | Freq: Three times a day (TID) | ORAL | 0 refills | Status: AC

## 2020-02-17 NOTE — ED Notes (Signed)
Patient transported to us 

## 2020-02-17 NOTE — ED Notes (Signed)
She is in no distress. As I write this she is being examined by our P.A. Gerre Pebbles.

## 2020-02-17 NOTE — ED Triage Notes (Signed)
[redacted] weeks pregnant, c/o vaginal bleeding and mild cramping today. Followed by [facility]

## 2020-02-17 NOTE — Discharge Instructions (Signed)
Please take the Keflex antibiotics, as directed.  Continue with your at home B6 medication as needed for nausea symptoms. Drink plenty of fluids.  Your comprehensive work-up today was reassuring.  You will need to follow-up with your OB/GYN for your appointment on 02/19/2020, as scheduled.  Return to the ED or seek immediate medical attention should you experience any new or worsening symptoms.

## 2020-02-17 NOTE — ED Provider Notes (Signed)
[facility] EMERGENCY DEPARTMENT Provider Note   CSN: [id number] Arrival date & time: 02/17/20  1107     History Chief Complaint  Patient presents with  . Vaginal Bleeding    [redacted] weeks pregnant    Alyssa Bates is a 29 y.o. female G4P111 approximately [redacted] weeks gestation followed by Dr. Haze Justin, [facility] who presents to the ED with complaints of vaginal bleeding and mild cramping.  Patient reports that she has a history of planned abortion, spontaneous abortion, as well as her last pregnancy which resulted in full-term vaginal delivery with no complications.  She reports that this pregnancy has been complicated by significant nausea and dietary restrictions.  She states that she has been taking B6 vitamins that with her nausea symptoms.  She states that this morning she noticed vaginal spotting which prompted her to come to the ED for evaluation.  She states that it is not as heavy as her menses.  She denies any recent sexual intercourse x2.5 months.  No precipitating injury or trauma.  She is also denying any significant abdominal pain, dysuria, increased urinary frequency, fevers or chills, or other symptoms.  HPI     Past Medical History:  Diagnosis Date  . Medical history non-contributory     There are no problems to display for this patient.   Past Surgical History:  Procedure Laterality Date  . NO PAST SURGERIES       OB History    Gravida  3   Para  1   Term  1   Preterm      AB      Living  1     SAB      TAB      Ectopic      Multiple      Live Births  1           No family history on file.  Social History   Tobacco Use  . Smoking status: Never Smoker  . Smokeless tobacco: Never Used  Vaping Use  . Vaping Use: Never used  Substance Use Topics  . Alcohol use: Yes    Comment: occ  . Drug use: No    Home Medications Prior to Admission medications   Medication Sig Start Date End Date Taking? Authorizing  Provider  cephALEXin (KEFLEX) 500 MG capsule Take 1 capsule (500 mg total) by mouth 3 (three) times daily for 5 days. 02/17/20 02/22/20  Lorelee New, PA-C  ondansetron (ZOFRAN ODT) 8 MG disintegrating tablet Take 1 tablet (8 mg total) by mouth every 8 (eight) hours as needed for nausea or vomiting. Patient not taking: Reported on 08/09/2016 04/28/16   Margarita Grizzle, MD  Prenatal Vit-Fe Fumarate-FA (PRENATAL COMPLETE) 14-0.4 MG TABS Uses directed on bottle instructions Patient not taking: Reported on 08/09/2016 08/28/14   Graylon Good, PA-C    Allergies    Ibuprofen  Review of Systems   Review of Systems  All other systems reviewed and are negative.   Physical Exam Updated Vital Signs BP 113/80 (BP Location: Right Arm)   Pulse (!) 101   Temp 99.4 F (37.4 C) (Oral)   Resp 16   Ht 5\' 3"  (1.6 m)   Wt 93.9 kg   LMP 03/29/2019   SpO2 97%   BMI 36.67 kg/m   Physical Exam Vitals and nursing note reviewed. Exam conducted with a chaperone present.  Constitutional:      Appearance: Normal appearance.  HENT:  Head: Normocephalic and atraumatic.  Eyes:     General: No scleral icterus.    Conjunctiva/sclera: Conjunctivae normal.  Cardiovascular:     Rate and Rhythm: Normal rate and regular rhythm.     Pulses: Normal pulses.     Heart sounds: Normal heart sounds.  Pulmonary:     Effort: Pulmonary effort is normal. No respiratory distress.     Breath sounds: Normal breath sounds.  Abdominal:     Comments: Pregnant.  No significant suprapubic or periumbilical tenderness to palpation.  No overlying skin changes.  Skin:    General: Skin is dry.     Capillary Refill: Capillary refill takes less than 2 seconds.  Neurological:     Mental Status: She is alert and oriented to person, place, and time.     GCS: GCS eye subscore is 4. GCS verbal subscore is 5. GCS motor subscore is 6.  Psychiatric:        Mood and Affect: Mood normal.        Behavior: Behavior normal.         Thought Content: Thought content normal.     ED Results / Procedures / Treatments   Labs (all labs ordered are listed, but only abnormal results are displayed) Labs Reviewed  URINALYSIS, ROUTINE W REFLEX MICROSCOPIC - Abnormal; Notable for the following components:      Result Value   Color, Urine ORANGE (*)    APPearance CLOUDY (*)    Hgb urine dipstick SMALL (*)    Ketones, ur >80 (*)    Protein, ur 30 (*)    Leukocytes,Ua MODERATE (*)    All other components within normal limits  HCG, QUANTITATIVE, PREGNANCY - Abnormal; Notable for the following components:   hCG, Beta Chain, Quant, S 18,425 (*)    All other components within normal limits  CBC - Abnormal; Notable for the following components:   HCT 35.8 (*)    RDW 16.8 (*)    All other components within normal limits  URINALYSIS, MICROSCOPIC (REFLEX) - Abnormal; Notable for the following components:   Bacteria, UA MANY (*)    All other components within normal limits    EKG None  Radiology US OB Limited  Result Date: 02/17/2020 CLINICAL DATA:  29 year old pregnant female with pelvic pain and spotting today. Reported gestational age of [redacted] weeks 4 days. EXAM: LIMITED OBSTETRIC ULTRASOUND FINDINGS: Number of Fetuses: 1 Heart Rate:  144 bpm Movement: Present Presentation: Breech Previa: No Placental Location: Fundal anterior Amniotic Fluid (Subjective): Normal BPD:  4.7cm 20w 4d Maternal Findings: Cervix:  Closed Uterus/Adnexae: No abnormality visualized. IMPRESSION: 1. Single living intrauterine gestation with estimated gestational age of [redacted] weeks 4 days. No acute abnormalities identified. 2. This exam is performed on an emergent basis and does not comprehensively evaluate fetal size, dating, or anatomy; follow-up complete OB US should be considered if further fetal assessment is warranted. Electronically Signed   By: Harmon Pier M.D.   On: 02/17/2020 12:53    Procedures Procedures (including critical care  time)  Medications Ordered in ED Medications - No data to display  ED Course  I have reviewed the triage vital signs and the nursing notes.  Pertinent labs & imaging results that were available during my care of the patient were reviewed by me and considered in my medical decision making (see chart for details).    MDM Rules/Calculators/A&P  Patient reports scant bleeding, however her bleeding is not significant or heavier than her typical menses.  No passage of large clots or tissue.  Patient denies any significant abdominal pain, fevers or chills, or other symptoms.  My physical exam was reassuring.  The cervical os was closed and the cervix was nonerythematous.  No obvious passage of fetal products.  There was no significant bleeding appreciated or evidence of traumatic laceration.  Bimanual exam was also unremarkable.    Labs CBC: Hemoglobin within normal notes of 12.0.  No leukocytosis. UA: Moderate leukocytes with orange color and cloudy appearance.  Many bacteria on reflex.  Beta-hCG quantitative: In-process.   Imaging US OB transvaginal < 14 weeks: Single live intrauterine gestation with estimated gestational age of [redacted] weeks 4 days.  No acute antibodies identified.  Given UA findings, will treat with Keflex x 5 days.  Patient has a follow-up with her OB/GYN on 02/19/2020.  She is reassured by today's competence of work-up and is in no acute distress.  Strict ED return precautions discussed.  Patient voices understanding is agreeable to plan.  Final Clinical Impression(s) / ED Diagnoses Final diagnoses:  Vaginal bleeding in pregnancy  Acute cystitis with hematuria    Rx / DC Orders ED Discharge Orders         Ordered    cephALEXin (KEFLEX) 500 MG capsule  3 times daily        02/17/20 1341           Elvera Maria 02/17/20 1341    Vanetta Mulders, MD 02/22/20 2201

## 2020-05-08 ENCOUNTER — Other Ambulatory Visit: Payer: Self-pay

## 2020-05-08 DIAGNOSIS — Z20822 Contact with and (suspected) exposure to covid-19: Secondary | ICD-10-CM

## 2020-05-10 LAB — NOVEL CORONAVIRUS, NAA: SARS-CoV-2, NAA: NOT DETECTED

## 2020-05-10 LAB — SARS-COV-2, NAA 2 DAY TAT

## 2021-12-12 IMAGING — US US OB LIMITED
1 series · 14 of 28 positions shown · non-contrast
Comparison: none

CLINICAL DATA: 29-year-old pregnant female with pelvic pain and
spotting today. Reported gestational age of 20 weeks 4 days.

EXAM:
LIMITED OBSTETRIC ULTRASOUND

[Series 1: us ob limited · 14 of 29 slices shown]
[im 2/29]
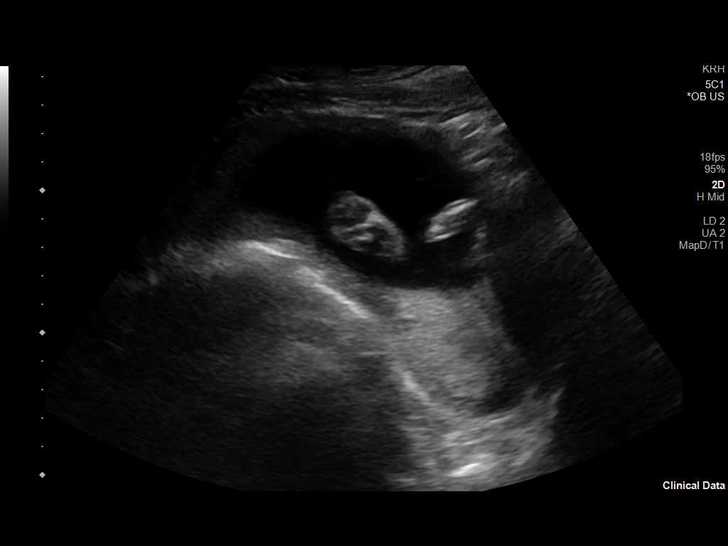
[im 4/29]
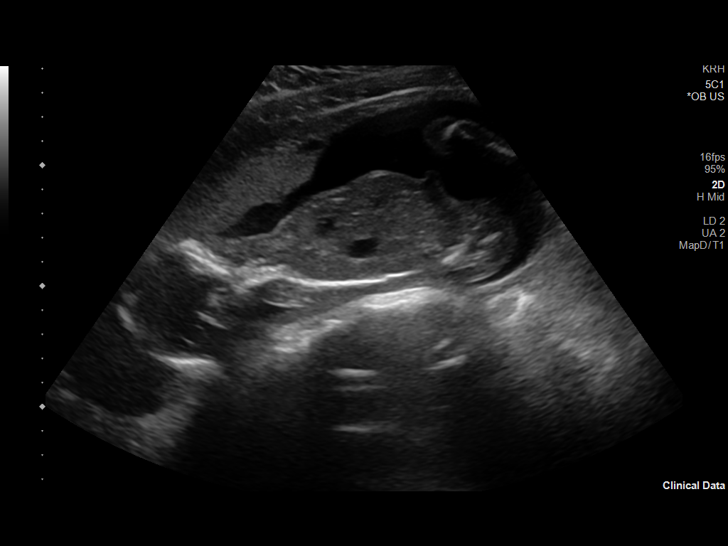
[im 6/29]
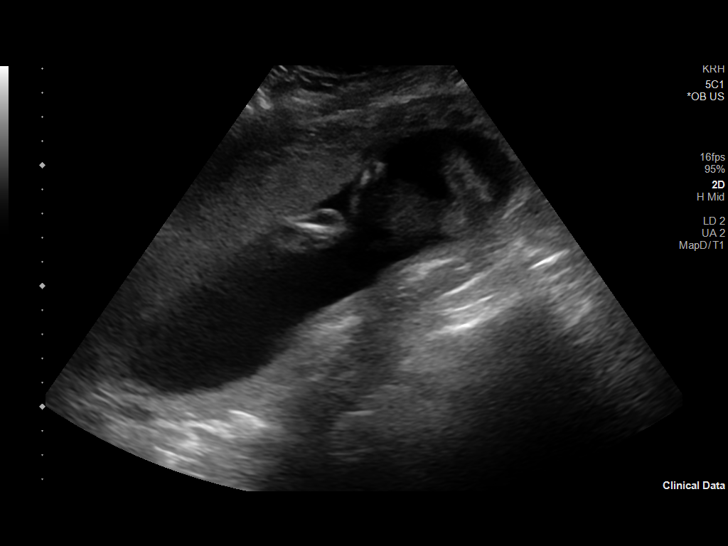
[im 8/29]
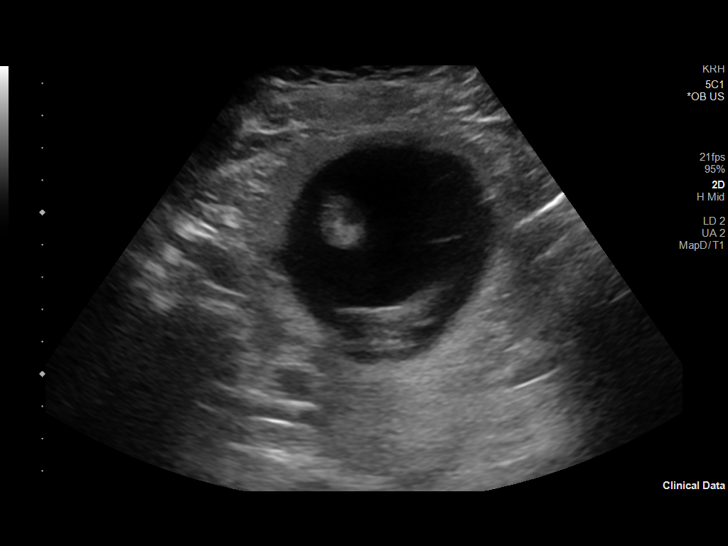
[im 10/29]
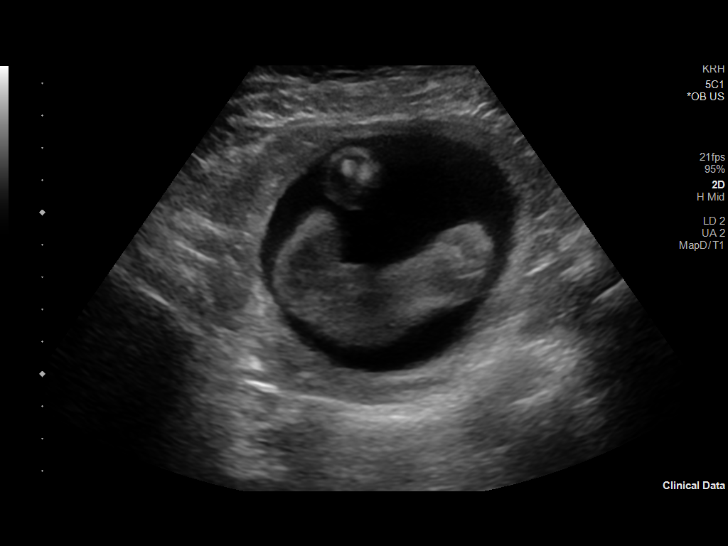
[im 12/29]
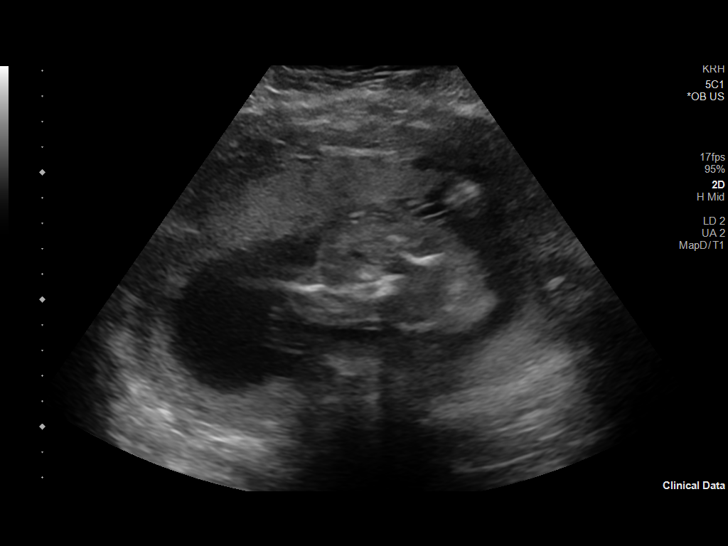
[im 14/29]
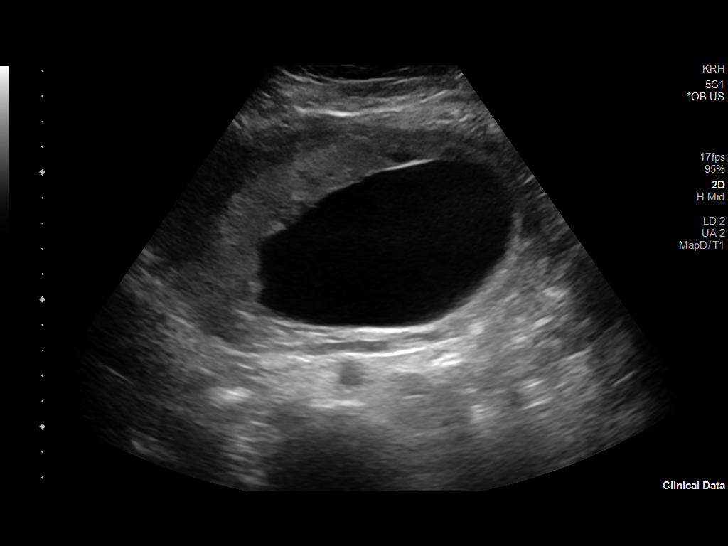
[im 16/29]
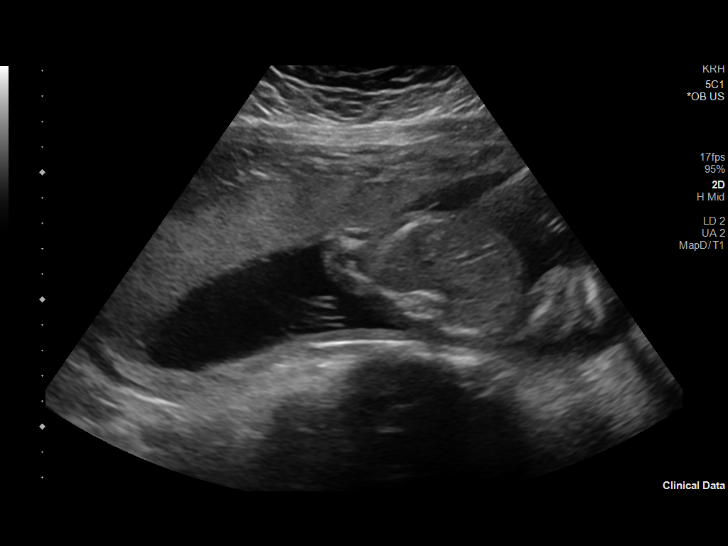
[im 18/29]
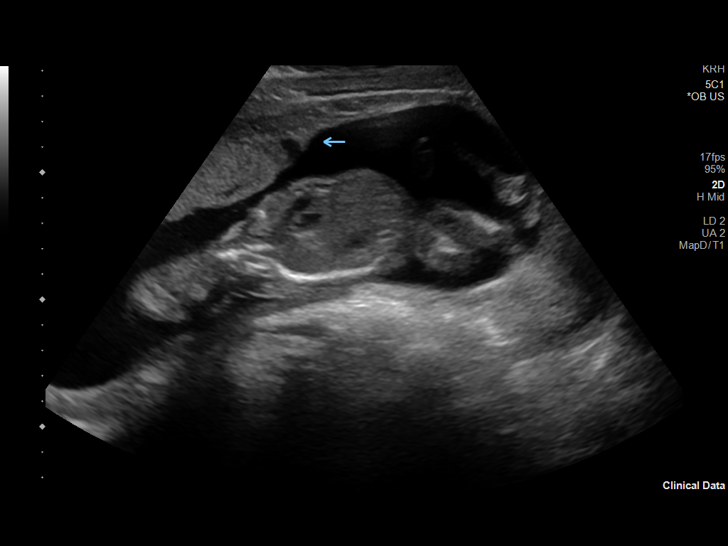
[im 20/29]
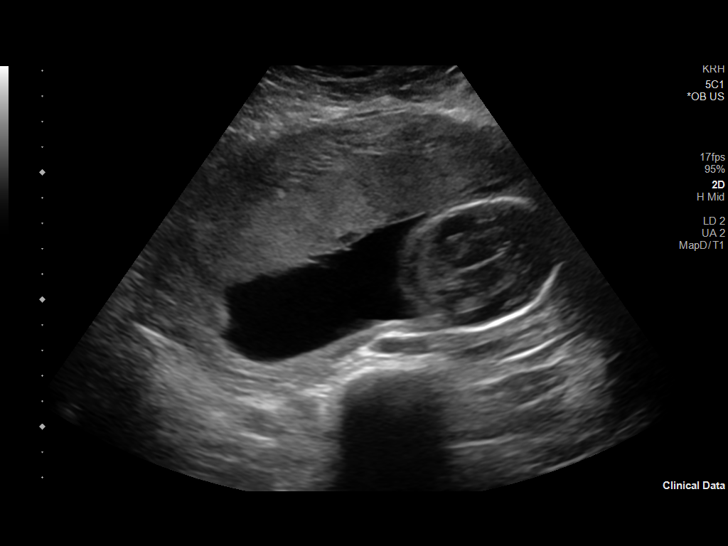
[im 22/29]
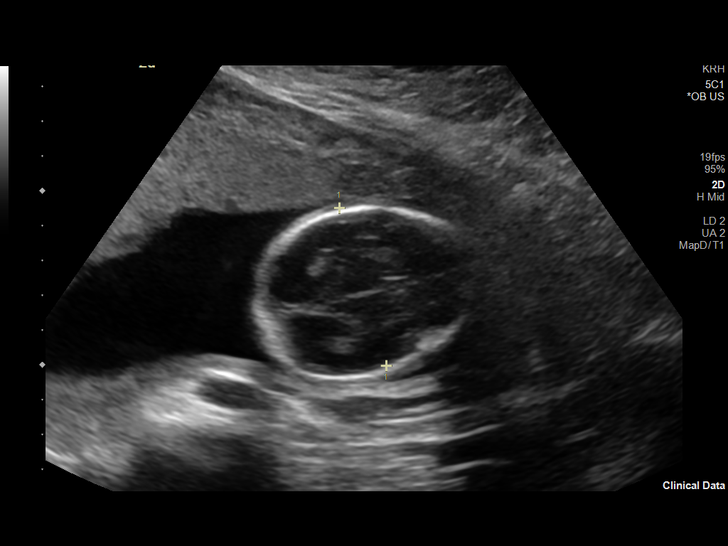
[im 24/29]
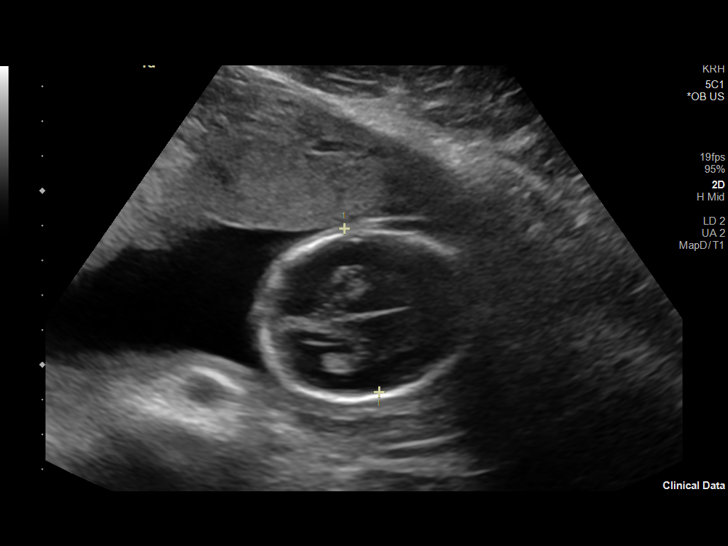
[im 26/29]
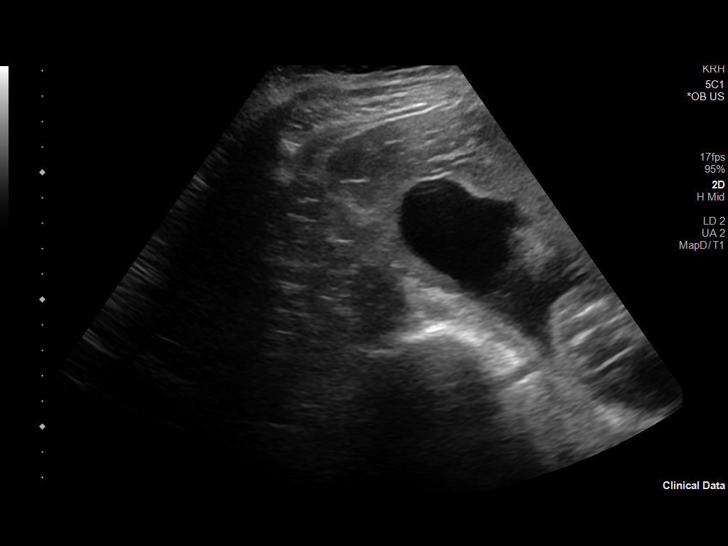
[im 29/29]
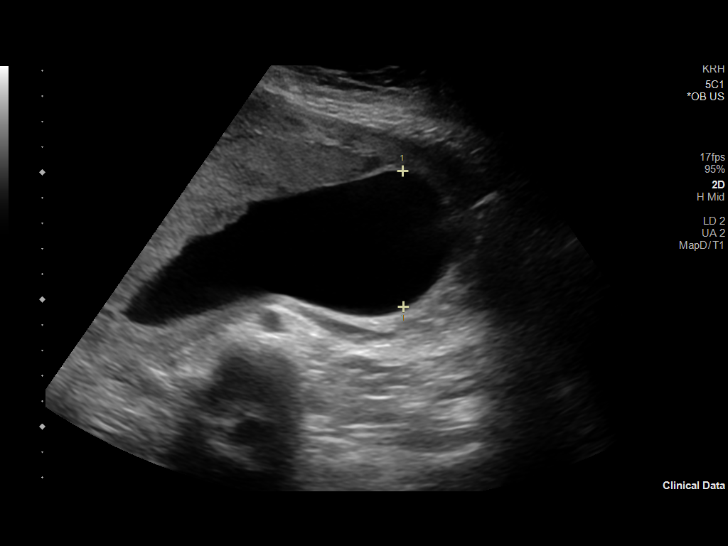

[14 of 28 positions shown; findings below may reference images not displayed]

FINDINGS: Number of Fetuses: 1

Heart Rate:  144 bpm

Movement: Present

Presentation: Breech

Previa: No

Placental Location: Fundal anterior

Amniotic Fluid (Subjective): Normal

BPD:  4.7cm 20w 4d

Maternal Findings:

Cervix:  Closed

Uterus/Adnexae: No abnormality visualized.
IMPRESSION: 1. Single living intrauterine gestation with estimated gestational
age of 20 weeks 4 days. No acute abnormalities identified.
2. This exam is performed on an emergent basis and does not
comprehensively evaluate fetal size, dating, or anatomy; follow-up
complete OB US should be considered if further fetal assessment is
warranted.

## 2023-01-07 ENCOUNTER — Other Ambulatory Visit: Payer: Self-pay

## 2023-01-07 ENCOUNTER — Emergency Department (HOSPITAL_BASED_OUTPATIENT_CLINIC_OR_DEPARTMENT_OTHER)
Admission: EM | Admit: 2023-01-07 | Discharge: 2023-01-07 | Disposition: A | Discharge status: Home / Self Care | Payer: Medicaid Other | Attending: Emergency Medicine | Admitting: Emergency Medicine

## 2023-01-07 DIAGNOSIS — Z1152 Encounter for screening for COVID-19: Secondary | ICD-10-CM | POA: Diagnosis not present

## 2023-01-07 DIAGNOSIS — J069 Acute upper respiratory infection, unspecified: Secondary | ICD-10-CM | POA: Diagnosis not present

## 2023-01-07 DIAGNOSIS — R059 Cough, unspecified: Secondary | ICD-10-CM | POA: Diagnosis present

## 2023-01-07 LAB — SARS CORONAVIRUS 2 BY RT PCR: SARS Coronavirus 2 by RT PCR: NEGATIVE

## 2023-01-07 NOTE — ED Triage Notes (Signed)
Patient presents to ED via POV. Here requesting a COVID test. Here with cough, runny nose and headache since yesterday.

## 2023-01-07 NOTE — ED Provider Notes (Signed)
[place] EMERGENCY DEPARTMENT AT [facility] Provider Note   CSN: [id number] Arrival date & time: 01/07/23  1428     History  Chief Complaint  Patient presents with   Cough    Alyssa Bates is a 32 y.o. female history of abnormal uterine bleeding presented for a cough.  Patient states the cough began last week on Thursday or Friday and is also endorsing generalized headaches with this nonproductive cough.  Patient denies any fevers.  Patient is been able to eat and drink without issue and denies any neck stiffness/pain, altered mental status, vision changes, ear pain or hearing changes, swelling or facial swelling, chest pain, shortness of breath, nausea/vomiting, abdominal pain.  Patient has been using Tylenol and NyQuil which has been helping.  Patient came here for COVID test.  Home Medications Prior to Admission medications   Medication Sig Start Date End Date Taking? Authorizing Provider  ondansetron (ZOFRAN ODT) 8 MG disintegrating tablet Take 1 tablet (8 mg total) by mouth every 8 (eight) hours as needed for nausea or vomiting. Patient not taking: Reported on 08/09/2016 04/28/16   Margarita Grizzle, MD  Prenatal Vit-Fe Fumarate-FA (PRENATAL COMPLETE) 14-0.4 MG TABS Uses directed on bottle instructions Patient not taking: Reported on 08/09/2016 08/28/14   Graylon Good, PA-C      Allergies    Ibuprofen    Review of Systems   Review of Systems  Respiratory:  Positive for cough.     Physical Exam Updated Vital Signs BP (!) 146/94 (BP Location: Left Arm)   Pulse 81   Temp 98.2 F (36.8 C) (Oral)   Resp 18   SpO2 98%  Physical Exam Vitals reviewed.  Constitutional:      General: She is not in acute distress.    Comments: Resting comfortably on phone  HENT:     Head: Normocephalic and atraumatic.     Nose: Nose normal.     Mouth/Throat:     Mouth: Mucous membranes are moist.     Pharynx: No oropharyngeal exudate or posterior oropharyngeal erythema.      Comments: No PTA noted Eyes:     Extraocular Movements: Extraocular movements intact.     Conjunctiva/sclera: Conjunctivae normal.     Pupils: Pupils are equal, round, and reactive to light.  Cardiovascular:     Rate and Rhythm: Normal rate and regular rhythm.     Pulses: Normal pulses.     Heart sounds: Normal heart sounds.     Comments: 2+ bilateral radial/dorsalis pedis pulses with regular rate Pulmonary:     Effort: Pulmonary effort is normal. No respiratory distress.     Breath sounds: Normal breath sounds.  Abdominal:     Palpations: Abdomen is soft.     Tenderness: There is no abdominal tenderness. There is no guarding or rebound.  Musculoskeletal:        General: Normal range of motion.     Cervical back: Normal range of motion and neck supple.     Comments: 5 out of 5 bilateral grip/leg extension strength  Skin:    General: Skin is warm and dry.     Capillary Refill: Capillary refill takes less than 2 seconds.  Neurological:     General: No focal deficit present.     Mental Status: She is alert and oriented to person, place, and time.     Comments: Sensation intact in all 4 limbs Able to walk without abnormalities  Psychiatric:  Mood and Affect: Mood normal.     ED Results / Procedures / Treatments   Labs (all labs ordered are listed, but only abnormal results are displayed) Labs Reviewed  SARS CORONAVIRUS 2 BY RT PCR    EKG None  Radiology No results found.  Procedures Procedures    Medications Ordered in ED Medications - No data to display  ED Course/ Medical Decision Making/ A&P                                 Medical Decision Making  Alyssa Bates 32 y.o. presented today for URI like symptoms. Working DDx that I considered at this time includes, but not limited to, viral illness, pharyngitis, mono, sinusitis, electrolyte abnormality.  R/o DDx: pharyngitis, mono, sinusitis, electrolyte abnormality: these diagnoses are not  consistent with patient's history, presentation, physical exam, labs/imaging findings.  Review of prior external notes: 08/10/2022 office visit  Unique Tests and My Interpretation:  COVID: Negative  Discussion with Independent Historian: None  Discussion of Management of Tests: None  Risk: Low: based on diagnostic testing/clinical impression and treatment plan  Risk Stratification Score: none  Plan: On exam patient was in no acute distress stable vitals.  Patient's physical exam was unremarkable.  Patient's COVID test from triage is negative and so patient wanted to be discharged.  I encouraged patient to use Tylenol ibuprofen every 6 hours and remain hydrated and to follow-up with primary care provider.  Patient given work note at her request.  Patient endorsing any red flag symptoms that would necessitate larger workup at this time.  Patient was given return precautions.patient stable for discharge at this time.  Patient verbalized understanding of plan.        Final Clinical Impression(s) / ED Diagnoses Final diagnoses:  Viral upper respiratory tract infection    Rx / DC Orders ED Discharge Orders     None         Remi Deter 01/07/23 1555    Loetta Rough, MD 01/07/23 2358

## 2023-01-07 NOTE — ED Notes (Signed)
Pt requesting to have a COVID test run.

## 2023-01-07 NOTE — Discharge Instructions (Signed)
Please follow-up with your primary care provider regarding recent symptoms and ER visit.  Today your COVID test was negative however you may have a viral illness still and you can take Tylenol or ibuprofen every 6 hours as needed for pain.  Please remain hydrated and eat food as tolerated.  If symptoms change or worsen please return to ER.

## 2023-02-10 ENCOUNTER — Emergency Department (HOSPITAL_BASED_OUTPATIENT_CLINIC_OR_DEPARTMENT_OTHER)
Admission: EM | Admit: 2023-02-10 | Discharge: 2023-02-10 | Disposition: A | Discharge status: Home / Self Care | Payer: Medicaid Other

## 2023-02-10 ENCOUNTER — Encounter (HOSPITAL_BASED_OUTPATIENT_CLINIC_OR_DEPARTMENT_OTHER): Payer: Self-pay | Admitting: Urology

## 2023-02-10 DIAGNOSIS — T63441A Toxic effect of venom of bees, accidental (unintentional), initial encounter: Secondary | ICD-10-CM | POA: Insufficient documentation

## 2023-02-10 MED ORDER — DEXAMETHASONE SODIUM PHOSPHATE 10 MG/ML IJ SOLN
10.0000 mg | Freq: Once | INTRAMUSCULAR | Status: AC
  Administered 2023-02-10: 10 mg via INTRAMUSCULAR
  Filled 2023-02-10: qty 1

## 2023-02-10 NOTE — ED Triage Notes (Signed)
Pt stung by bee Saturday to right forearm States continued bruising  Irritation better today  Denies pain  No allergies to bees

## 2023-02-10 NOTE — Discharge Instructions (Addendum)
As discussed, steroid shot will help with any inflammation or itching.  You can take Benadryl every 6-8 hours as needed for relief.  You can check your lab results on epic.  You might have a elevated white count (WBC) as a result of an allergic reaction.  Follow-up with your PCP in 1 week for reevaluation of your symptoms.  Get help right away if: You have symptoms of a severe allergic reaction. These include: Chest tightness or pain. Wheezing, or trouble swallowing or breathing. Light-headedness, dizziness, or fainting. Itchy, raised, red patches on the skin beyond the sting site. Abdominal cramping, nausea, vomiting, or diarrhea. Trouble swallowing or a swollen tongue, throat, or lips.

## 2023-02-10 NOTE — ED Provider Notes (Signed)
[place] [facility] AT [facility] Provider Note   CSN: [id number] Arrival date & time: 02/10/23  1404     History  Chief Complaint  Patient presents with   Insect Bite    Alyssa Bates is a 32 y.o. female with no significant past medical history presents the ED today for an insect bite.  She reports she was stung by a bee on her right forearm about 5 days ago.  She reports some bruising, swelling, and itching to the site of the sting.  She has not taken anything for the discomfort.  Denies shortness of breath, angioedema, or throat swelling sensation.  No additional complaints or concerns at this time.    Home Medications Prior to Admission medications   Medication Sig Start Date End Date Taking? Authorizing Provider  ondansetron (ZOFRAN ODT) 8 MG disintegrating tablet Take 1 tablet (8 mg total) by mouth every 8 (eight) hours as needed for nausea or vomiting. Patient not taking: Reported on 08/09/2016 04/28/16   Margarita Grizzle, MD  Prenatal Vit-Fe Fumarate-FA (PRENATAL COMPLETE) 14-0.4 MG TABS Uses directed on bottle instructions Patient not taking: Reported on 08/09/2016 08/28/14   Graylon Good, PA-C      Allergies    Ibuprofen    Review of Systems   Review of Systems  Skin:  Positive for rash.  All other systems reviewed and are negative.   Physical Exam Updated Vital Signs BP (!) 110/44 (BP Location: Left Arm)   Pulse 86   Temp 98.6 F (37 C) (Oral)   Resp 18   Ht 5\' 3"  (1.6 m)   Wt 93.9 kg   SpO2 96%   BMI 36.67 kg/m  Physical Exam Vitals and nursing note reviewed.  Constitutional:      General: She is not in acute distress.    Appearance: Normal appearance.  HENT:     Head: Normocephalic and atraumatic.     Mouth/Throat:     Mouth: Mucous membranes are moist.  Eyes:     Conjunctiva/sclera: Conjunctivae normal.     Pupils: Pupils are equal, round, and reactive to light.  Cardiovascular:     Rate and Rhythm: Normal rate  and regular rhythm.     Pulses: Normal pulses.  Pulmonary:     Effort: Pulmonary effort is normal.     Breath sounds: Normal breath sounds.  Abdominal:     Palpations: Abdomen is soft.     Tenderness: There is no abdominal tenderness.  Skin:    General: Skin is warm and dry.     Findings: Bruising present. No rash.     Comments: Swelling and bruising present at the  Neurological:     General: No focal deficit present.     Mental Status: She is alert.  Psychiatric:        Mood and Affect: Mood normal.        Behavior: Behavior normal.     ED Results / Procedures / Treatments   Labs (all labs ordered are listed, but only abnormal results are displayed) Labs Reviewed  CBC WITH DIFFERENTIAL/PLATELET    EKG None  Radiology No results found.  Procedures Procedures: not indicated.   Medications Ordered in ED Medications  dexamethasone (DECADRON) injection 10 mg (10 mg Intramuscular Given 02/10/23 1532)    ED Course/ Medical Decision Making/ A&P  Medical Decision Making Amount and/or Complexity of Data Reviewed Labs: ordered.  Risk Prescription drug management.   This patient presents to the ED for concern of bee sting, this involves an extensive number of treatment options, and is a complaint that carries with it a high risk of complications and morbidity.   Differential diagnosis includes: Contact dermatitis, urticaria, angioedema, anaphylaxis, etc. Low suspicion for angioedema or anaphylaxis.  No swelling of the tongue or oropharynx.  No respiratory distress.   Comorbidities  See HPI above   Additional History  Additional history obtained from previous records.  Lab Tests  I ordered and personally interpreted labs.  The pertinent results include:   I ordered a CBC per patient's request to check white blood cell count.  Patient stated that she needed to leave to pick up her child from school and would like to check the  results on MyChart.  Labs pending at discharge.   Imaging Studies  Not indicated.   Problem List / ED Course / Critical Interventions / Medication Management  Bee sting to the right forearm I ordered medications including: Decadron for inflammation and pruritus - given prior to discharge I have reviewed the patients home medicines and have made adjustments as needed.   Social Determinants of Health  Housing   Test / Admission - Considered  Discussed physical exam findings with patient. She is hemodynamically stable and safe for discharge home. Return precautions provided.       Final Clinical Impression(s) / ED Diagnoses Final diagnoses:  Bee sting, accidental or unintentional, initial encounter    Rx / DC Orders ED Discharge Orders     None         Maxwell Marion, PA-C 02/10/23 1551    Laurence Spates, MD 02/11/23 1500

## 2023-02-14 NOTE — Plan of Care (Signed)
[facility] Tonsillectomy/Adenoidectomy, Postoperative PEDS care plan entered in error.

## 2023-03-10 ENCOUNTER — Encounter (HOSPITAL_BASED_OUTPATIENT_CLINIC_OR_DEPARTMENT_OTHER): Payer: Self-pay | Admitting: Urology

## 2023-03-10 ENCOUNTER — Emergency Department (HOSPITAL_BASED_OUTPATIENT_CLINIC_OR_DEPARTMENT_OTHER): Payer: Medicaid Other

## 2023-03-10 ENCOUNTER — Other Ambulatory Visit: Payer: Self-pay

## 2023-03-10 ENCOUNTER — Emergency Department (HOSPITAL_BASED_OUTPATIENT_CLINIC_OR_DEPARTMENT_OTHER)
Admission: EM | Admit: 2023-03-10 | Discharge: 2023-03-10 | Disposition: A | Discharge status: Home / Self Care | Payer: Medicaid Other | Attending: Emergency Medicine | Admitting: Emergency Medicine

## 2023-03-10 DIAGNOSIS — R0981 Nasal congestion: Secondary | ICD-10-CM | POA: Diagnosis present

## 2023-03-10 DIAGNOSIS — Z1152 Encounter for screening for COVID-19: Secondary | ICD-10-CM | POA: Insufficient documentation

## 2023-03-10 DIAGNOSIS — R0789 Other chest pain: Secondary | ICD-10-CM | POA: Insufficient documentation

## 2023-03-10 LAB — CBC WITH DIFFERENTIAL/PLATELET
Abs Immature Granulocytes: 0.02 10*3/uL (ref 0.00–0.07)
Basophils Absolute: 0 10*3/uL (ref 0.0–0.1)
Basophils Relative: 0 %
Eosinophils Absolute: 0.1 10*3/uL (ref 0.0–0.5)
Eosinophils Relative: 2 %
HCT: 38.9 % (ref 36.0–46.0)
Hemoglobin: 12.7 g/dL (ref 12.0–15.0)
Immature Granulocytes: 0 %
Lymphocytes Relative: 25 %
Lymphs Abs: 1.8 10*3/uL (ref 0.7–4.0)
MCH: 27.1 pg (ref 26.0–34.0)
MCHC: 32.6 g/dL (ref 30.0–36.0)
MCV: 83.1 fL (ref 80.0–100.0)
Monocytes Absolute: 0.4 10*3/uL (ref 0.1–1.0)
Monocytes Relative: 5 %
Neutro Abs: 4.9 10*3/uL (ref 1.7–7.7)
Neutrophils Relative %: 68 %
Platelets: 261 10*3/uL (ref 150–400)
RBC: 4.68 MIL/uL (ref 3.87–5.11)
RDW: 13.7 % (ref 11.5–15.5)
WBC: 7.3 10*3/uL (ref 4.0–10.5)
nRBC: 0 % (ref 0.0–0.2)

## 2023-03-10 LAB — BASIC METABOLIC PANEL
Anion gap: 6 (ref 5–15)
BUN: 12 mg/dL (ref 6–20)
CO2: 25 mmol/L (ref 22–32)
Calcium: 8.4 mg/dL — ABNORMAL LOW (ref 8.9–10.3)
Chloride: 105 mmol/L (ref 98–111)
Creatinine, Ser: 0.69 mg/dL (ref 0.44–1.00)
GFR, Estimated: >60 mL/min (ref >60)
Glucose, Bld: 96 mg/dL (ref 70–99)
Potassium: 3.8 mmol/L (ref 3.5–5.1)
Sodium: 136 mmol/L (ref 135–145)

## 2023-03-10 LAB — RESP PANEL BY RT-PCR (RSV, FLU A&B, COVID)  RVPGX2
Influenza A by PCR: NEGATIVE
Influenza B by PCR: NEGATIVE
Resp Syncytial Virus by PCR: NEGATIVE
SARS Coronavirus 2 by RT PCR: NEGATIVE

## 2023-03-10 LAB — MAGNESIUM: Magnesium: 2 mg/dL (ref 1.7–2.4)

## 2023-03-10 LAB — TROPONIN I (HIGH SENSITIVITY): Troponin I (High Sensitivity): <2 ng/L (ref <18)

## 2023-03-10 MED ORDER — DIPHENHYDRAMINE HCL 25 MG PO CAPS
25.0000 mg | ORAL_CAPSULE | Freq: Once | ORAL | Status: AC
Start: 2023-03-10 — End: 2023-03-10
  Administered 2023-03-10: 25 mg via ORAL
  Filled 2023-03-10: qty 1

## 2023-03-10 NOTE — ED Provider Notes (Signed)
[place] [facility] Provider Note   CSN: [id number] Arrival date & time: 03/10/23  4098     History  Chief Complaint  Patient presents with   Nasal Congestion    Alyssa Bates is a 32 y.o. female.  32 year old female presents today for concern of chest tightness/chest heaviness, difficulty breathing that started this morning.  She states for the past 3 days she has had a headache and this morning she decided to take Goody powder.  She states it resolved her headache but then she developed nasal congestion, chest tightness/heaviness, and difficulty breathing.  Denies any health problems and does not take any daily medications.  No significant family history of heart problems.  She is concerned regarding an allergic reaction.  The history is provided by the patient. No language interpreter was used.       Home Medications Prior to Admission medications   Medication Sig Start Date End Date Taking? Authorizing Provider  ondansetron (ZOFRAN ODT) 8 MG disintegrating tablet Take 1 tablet (8 mg total) by mouth every 8 (eight) hours as needed for nausea or vomiting. Patient not taking: Reported on 08/09/2016 04/28/16   Margarita Grizzle, MD  Prenatal Vit-Fe Fumarate-FA (PRENATAL COMPLETE) 14-0.4 MG TABS Uses directed on bottle instructions Patient not taking: Reported on 08/09/2016 08/28/14   Graylon Good, PA-C      Allergies    Ibuprofen    Review of Systems   Review of Systems  Constitutional:  Negative for chills and fever.  HENT:  Negative for trouble swallowing.   Respiratory:  Positive for shortness of breath.   Cardiovascular:  Positive for chest pain. Negative for palpitations and leg swelling.  Neurological:  Negative for light-headedness.  All other systems reviewed and are negative.   Physical Exam Updated Vital Signs BP 138/65 (BP Location: Right Arm)   Pulse 74   Temp 97.7 F (36.5 C) (Oral)   Resp 18   Ht 5\' 3"  (1.6  m)   Wt 93.9 kg   LMP 03/02/2023   SpO2 100%   BMI 36.67 kg/m  Physical Exam Vitals and nursing note reviewed.  Constitutional:      General: She is not in acute distress.    Appearance: Normal appearance. She is not ill-appearing.  HENT:     Head: Normocephalic and atraumatic.     Nose: Nose normal.  Eyes:     General: No scleral icterus.    Extraocular Movements: Extraocular movements intact.     Conjunctiva/sclera: Conjunctivae normal.  Cardiovascular:     Rate and Rhythm: Normal rate and regular rhythm.     Pulses: Normal pulses.     Heart sounds: Normal heart sounds.  Pulmonary:     Effort: Pulmonary effort is normal. No respiratory distress.     Breath sounds: Normal breath sounds. No wheezing or rales.  Musculoskeletal:        General: Normal range of motion.     Cervical back: Normal range of motion.  Skin:    General: Skin is warm and dry.  Neurological:     General: No focal deficit present.     Mental Status: She is alert. Mental status is at baseline.     ED Results / Procedures / Treatments   Labs (all labs ordered are listed, but only abnormal results are displayed) Labs Reviewed  RESP PANEL BY RT-PCR (RSV, FLU A&B, COVID)  RVPGX2  CBC WITH DIFFERENTIAL/PLATELET  BASIC METABOLIC PANEL  MAGNESIUM  TROPONIN I (HIGH SENSITIVITY)    EKG None  Radiology No results found.  Procedures Procedures    Medications Ordered in ED Medications  diphenhydrAMINE (BENADRYL) capsule 25 mg (has no administration in time range)    ED Course/ Medical Decision Making/ A&P                                 Medical Decision Making Amount and/or Complexity of Data Reviewed Labs: ordered. Radiology: ordered.   Medical Decision Making / ED Course   This patient presents to the ED for concern of chest pain, nasal congestion, this involves an extensive number of treatment options, and is a complaint that carries with it a high risk of complications and  morbidity.  The differential diagnosis includes viral URI, allergic reaction, ACS, pneumonia, PE  MDM: 32 year old female presents today for concern of chest tightness/heaviness that started this morning.  Headache ongoing for the past 3 days.  She is concerned may be an allergic reaction.  Will obtain ACS workup, and chest x-ray and give a dose of Benadryl.   CBC is unremarkable, BMP without acute concern.  Troponin undetectable.  Chest x-ray without acute cardiopulmonary process.  EKG without acute ischemic change.  Low heart score.  Low suspicion for ACS.  She is without tachypnea, hypoxia, or tachycardia.  No previous history of DVT or PE.  Low suspicion for PE.  Symptoms resolved prior to discharge.  Discharged in stable condition.  Return precaution discussed.  Patient voices understanding and is in agreement with plan.   Lab Tests: -I ordered, reviewed, and interpreted labs.   The pertinent results include:   Labs Reviewed  BASIC METABOLIC PANEL - Abnormal; Notable for the following components:      Result Value   Calcium 8.4 (*)    All other components within normal limits  RESP PANEL BY RT-PCR (RSV, FLU A&B, COVID)  RVPGX2  CBC WITH DIFFERENTIAL/PLATELET  MAGNESIUM  TROPONIN I (HIGH SENSITIVITY)      EKG  EKG Interpretation Date/Time:    Ventricular Rate:    PR Interval:    QRS Duration:    QT Interval:    QTC Calculation:   R Axis:      Text Interpretation:           Imaging Studies ordered: I ordered imaging studies including cxr I independently visualized and interpreted imaging. I agree with the radiologist interpretation   Medicines ordered and prescription drug management: Meds ordered this encounter  Medications   diphenhydrAMINE (BENADRYL) capsule 25 mg    -I have reviewed the patients home medicines and have made adjustments as needed   Cardiac Monitoring: The patient was maintained on a cardiac monitor.  I personally viewed and interpreted  the cardiac monitored which showed an underlying rhythm of: NSR   Reevaluation: After the interventions noted above, I reevaluated the patient and found that they have :improved  Co morbidities that complicate the patient evaluation  Past Medical History:  Diagnosis Date   Medical history non-contributory       Dispostion: Discharged in stable condition.  Return precaution discussed.  Erroneous SpO2 sats of 70% documented.  Patient without any shortness of breath.  Advised nurse to recheck this prior to dc.   Final Clinical Impression(s) / ED Diagnoses Final diagnoses:  Nasal congestion  Atypical chest pain    Rx / DC Orders ED Discharge Orders  None         Marita Kansas, PA-C 03/10/23 1315    Alvira Monday, MD 03/10/23 2222

## 2023-03-10 NOTE — ED Triage Notes (Signed)
Pt states headache that started this am, took goody powder and states since chest feels heavy and starting to have congestion and right eye swelling   States exposure to covid at work

## 2023-03-10 NOTE — ED Notes (Signed)
Pt alert and oriented X 4 at the time of discharge. RR even and unlabored. No acute distress noted. Pt verbalized understanding of discharge instructions as discussed. Pt ambulatory to lobby at time of discharge.

## 2023-03-10 NOTE — Discharge Instructions (Signed)
Workup was reassuring.  No concerning cause of your symptoms on today's workup including chest x-ray.  Follow-up with your primary care provider.  For any concerning symptoms return to the emergency room.

## 2023-04-30 ENCOUNTER — Emergency Department (HOSPITAL_BASED_OUTPATIENT_CLINIC_OR_DEPARTMENT_OTHER)
Admission: EM | Admit: 2023-04-30 | Discharge: 2023-04-30 | Disposition: A | Discharge status: Home / Self Care | Payer: Medicaid Other | Attending: Emergency Medicine | Admitting: Emergency Medicine

## 2023-04-30 ENCOUNTER — Other Ambulatory Visit: Payer: Self-pay

## 2023-04-30 ENCOUNTER — Encounter (HOSPITAL_BASED_OUTPATIENT_CLINIC_OR_DEPARTMENT_OTHER): Payer: Self-pay

## 2023-04-30 DIAGNOSIS — J101 Influenza due to other identified influenza virus with other respiratory manifestations: Secondary | ICD-10-CM | POA: Diagnosis not present

## 2023-04-30 DIAGNOSIS — Z20822 Contact with and (suspected) exposure to covid-19: Secondary | ICD-10-CM | POA: Diagnosis not present

## 2023-04-30 DIAGNOSIS — R059 Cough, unspecified: Secondary | ICD-10-CM | POA: Diagnosis present

## 2023-04-30 LAB — RESP PANEL BY RT-PCR (RSV, FLU A&B, COVID)  RVPGX2
Influenza A by PCR: POSITIVE — AB
Influenza B by PCR: NEGATIVE
Resp Syncytial Virus by PCR: NEGATIVE
SARS Coronavirus 2 by RT PCR: NEGATIVE

## 2023-04-30 NOTE — ED Triage Notes (Signed)
 The patient has had a cough for three days.

## 2023-04-30 NOTE — Discharge Instructions (Signed)
 It was a pleasure taking care of you here in the emergency department  Your flu test was positive this is likely the cause of your symptoms  I would recommend taking Tylenol  as needed for fever.  Follow-up with primary care provider in few days for reevaluation if still feeling unwell  Return for new or worsening symptoms  May also do over-the-counter Zyrtec or Flonase for any nasal drainage

## 2023-04-30 NOTE — ED Provider Notes (Signed)
 [place] [facility] Provider Note   CSN: [id number] Arrival date & time: 04/30/23  1103     History  Chief Complaint  Patient presents with   Cough   Nasal Congestion    Alyssa Bates is a 33 y.o. female here for evaluation of cough and rhinorrhea.  Symptoms x 3 days.  They were around someone with influenza over the holidays.  Some chillls. No fever, CP, SOB, abd pain, emesis, syncope. Daughter here with same sx.  HPI     Home Medications Prior to Admission medications   Medication Sig Start Date End Date Taking? Authorizing Provider  ondansetron  (ZOFRAN  ODT) 8 MG disintegrating tablet Take 1 tablet (8 mg total) by mouth every 8 (eight) hours as needed for nausea or vomiting. Patient not taking: Reported on 08/09/2016 04/28/16   Levander Houston, MD  Prenatal Vit-Fe Fumarate-FA (PRENATAL COMPLETE) 14-0.4 MG TABS Uses directed on bottle instructions Patient not taking: Reported on 08/09/2016 08/28/14   Lennie Arthea DEL, PA-C      Allergies    Ibuprofen    Review of Systems   Review of Systems  Constitutional: Negative.   HENT:  Positive for congestion and rhinorrhea.   Respiratory:  Positive for cough.   Cardiovascular: Negative.   Gastrointestinal: Negative.   Genitourinary: Negative.   Musculoskeletal: Negative.   Skin: Negative.   Neurological: Negative.   All other systems reviewed and are negative.   Physical Exam Updated Vital Signs BP 129/77 (BP Location: Left Arm)   Pulse 87   Temp 98.5 F (36.9 C) (Oral)   Resp 16   Ht 5' 3 (1.6 m)   Wt 106.6 kg   SpO2 100%   BMI 41.63 kg/m  Physical Exam Vitals and nursing note reviewed.  Constitutional:      General: She is not in acute distress.    Appearance: She is well-developed. She is not ill-appearing, toxic-appearing or diaphoretic.  HENT:     Head: Atraumatic.  Eyes:     Pupils: Pupils are equal, round, and reactive to light.  Cardiovascular:     Rate and  Rhythm: Normal rate.     Pulses: Normal pulses.     Heart sounds: Normal heart sounds.  Pulmonary:     Effort: Pulmonary effort is normal. No respiratory distress.     Breath sounds: Normal breath sounds.  Abdominal:     General: There is no distension.  Musculoskeletal:        General: Normal range of motion.     Cervical back: Normal range of motion.  Skin:    General: Skin is warm and dry.  Neurological:     General: No focal deficit present.     Mental Status: She is alert.  Psychiatric:        Mood and Affect: Mood normal.     ED Results / Procedures / Treatments   Labs (all labs ordered are listed, but only abnormal results are displayed) Labs Reviewed  RESP PANEL BY RT-PCR (RSV, FLU A&B, COVID)  RVPGX2 - Abnormal; Notable for the following components:      Result Value   Influenza A by PCR POSITIVE (*)    All other components within normal limits    EKG None  Radiology No results found.  Procedures Procedures    Medications Ordered in ED Medications - No data to display  ED Course/ Medical Decision Making/ A&P   33 year old here for evaluation URI symptoms x 3  days.  They were around family who tested positive for influenza.  She has had cough for 3 days. No SOB, CP.  Appears otherwise well, she is afebrile, nonseptic, not ill-appearing.  She is PERC negative.  Appears clinically well-hydrated.  Labs personally viewed and interpreted Influenza positive  Discussed Tamiflu risk versus benefit, declined  Discussed results with patient.  Discussed symptomatic management.  Will have her follow-up outpatient, return for new or worsening symptoms  The patient has been appropriately medically screened and/or stabilized in the ED. I have low suspicion for any other emergent medical condition which would require further screening, evaluation or treatment in the ED or require inpatient management.  Patient is hemodynamically stable and in no acute distress.   Patient able to ambulate in department prior to ED.  Evaluation does not show acute pathology that would require ongoing or additional emergent interventions while in the emergency department or further inpatient treatment.  I have discussed the diagnosis with the patient and answered all questions.  Pain is been managed while in the emergency department and patient has no further complaints prior to discharge.  Patient is comfortable with plan discussed in room and is stable for discharge at this time.  I have discussed strict return precautions for returning to the emergency department.  Patient was encouraged to follow-up with PCP/specialist refer to at discharge.                                Medical Decision Making Amount and/or Complexity of Data Reviewed Independent Historian:     Details: Daughter in room External Data Reviewed: labs and notes. Labs: ordered. Decision-making details documented in ED Course.  Risk OTC drugs. Prescription drug management. Decision regarding hospitalization. Diagnosis or treatment significantly limited by social determinants of health.           Final Clinical Impression(s) / ED Diagnoses Final diagnoses:  Influenza A    Rx / DC Orders ED Discharge Orders     None         Wiley Magan A, PA-C 04/30/23 1431    Dreama Longs, MD 05/01/23 0010
